# Patient Record
Sex: Male | Born: 1990 | Race: White | Hispanic: No | Marital: Single | Smoking: Former smoker
Health system: Southern US, Community
[De-identification: ages and names within clinical notes are randomized; demographics above are authoritative.]

## PROBLEM LIST (undated history)

## (undated) DIAGNOSIS — J45909 Unspecified asthma, uncomplicated: Secondary | ICD-10-CM

## (undated) HISTORY — PX: OTHER SURGICAL HISTORY: SHX169

---
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
## 2014-07-30 ENCOUNTER — Emergency Department (HOSPITAL_COMMUNITY)
Admission: EM | Admit: 2014-07-30 | Discharge: 2014-07-31 | Discharge status: Against Medical Advice | Payer: Self-pay | Attending: Emergency Medicine | Admitting: Emergency Medicine

## 2014-07-30 ENCOUNTER — Encounter (HOSPITAL_COMMUNITY): Payer: Self-pay | Admitting: Emergency Medicine

## 2014-07-30 ENCOUNTER — Emergency Department (HOSPITAL_COMMUNITY): Payer: Self-pay

## 2014-07-30 DIAGNOSIS — J45901 Unspecified asthma with (acute) exacerbation: Secondary | ICD-10-CM | POA: Insufficient documentation

## 2014-07-30 DIAGNOSIS — Z720 Tobacco use: Secondary | ICD-10-CM | POA: Insufficient documentation

## 2014-07-30 HISTORY — DX: Unspecified asthma, uncomplicated: J45.909

## 2014-07-30 MED ORDER — ALBUTEROL SULFATE (2.5 MG/3ML) 0.083% IN NEBU
5.0000 mg | INHALATION_SOLUTION | Freq: Once | RESPIRATORY_TRACT | Status: AC
  Administered 2014-07-30: 5 mg via RESPIRATORY_TRACT
  Filled 2014-07-30: qty 6

## 2014-07-30 NOTE — ED Notes (Signed)
Pt. reports progressing asthma with productive cough/wheezing for 2 days , pt. ran out of his rescue MDI , denies fever or chills.

## 2014-09-14 ENCOUNTER — Encounter (HOSPITAL_COMMUNITY): Payer: Self-pay | Admitting: Emergency Medicine

## 2014-09-14 ENCOUNTER — Emergency Department (HOSPITAL_COMMUNITY)
Admission: EM | Admit: 2014-09-14 | Discharge: 2014-09-14 | Disposition: A | Discharge status: Home / Self Care | Payer: Self-pay | Attending: Emergency Medicine | Admitting: Emergency Medicine

## 2014-09-14 DIAGNOSIS — Z720 Tobacco use: Secondary | ICD-10-CM | POA: Insufficient documentation

## 2014-09-14 DIAGNOSIS — J45901 Unspecified asthma with (acute) exacerbation: Secondary | ICD-10-CM | POA: Insufficient documentation

## 2014-09-14 DIAGNOSIS — R000 Tachycardia, unspecified: Secondary | ICD-10-CM | POA: Insufficient documentation

## 2014-09-14 MED ORDER — ALBUTEROL SULFATE HFA 108 (90 BASE) MCG/ACT IN AERS
2.0000 | INHALATION_SPRAY | RESPIRATORY_TRACT | Status: DC | PRN
  Administered 2014-09-14: 2 via RESPIRATORY_TRACT
  Filled 2014-09-14: qty 6.7

## 2014-09-14 MED ORDER — PREDNISONE 20 MG PO TABS: 40.0000 mg | ORAL_TABLET | Freq: Every day | ORAL | Status: DC

## 2014-09-14 MED ORDER — ALBUTEROL SULFATE (2.5 MG/3ML) 0.083% IN NEBU
5.0000 mg | INHALATION_SOLUTION | Freq: Once | RESPIRATORY_TRACT | Status: AC
  Administered 2014-09-14: 5 mg via RESPIRATORY_TRACT
  Filled 2014-09-14 (×2): qty 6

## 2014-09-14 MED ORDER — IPRATROPIUM BROMIDE 0.02 % IN SOLN
0.5000 mg | Freq: Once | RESPIRATORY_TRACT | Status: AC
  Administered 2014-09-14: 0.5 mg via RESPIRATORY_TRACT
  Filled 2014-09-14: qty 2.5

## 2014-09-14 MED ORDER — ALBUTEROL SULFATE HFA 108 (90 BASE) MCG/ACT IN AERS: 1.0000 | INHALATION_SPRAY | Freq: Four times a day (QID) | RESPIRATORY_TRACT | Status: DC | PRN

## 2014-09-14 MED ORDER — ALBUTEROL SULFATE (2.5 MG/3ML) 0.083% IN NEBU
5.0000 mg | INHALATION_SOLUTION | Freq: Once | RESPIRATORY_TRACT | Status: AC
Start: 2014-09-14 — End: 2014-09-14
  Administered 2014-09-14: 5 mg via RESPIRATORY_TRACT
  Filled 2014-09-14: qty 6

## 2014-09-14 MED ORDER — PREDNISONE 20 MG PO TABS
60.0000 mg | ORAL_TABLET | Freq: Once | ORAL | Status: AC
  Administered 2014-09-14: 60 mg via ORAL
  Filled 2014-09-14: qty 3

## 2014-09-14 NOTE — Discharge Instructions (Signed)

## 2014-09-14 NOTE — ED Provider Notes (Signed)
CSN: [id number]     Arrival date & time 09/14/14  2132 History   First MD Initiated Contact with Patient 09/14/14 2141     Chief Complaint  Patient presents with  . Shortness of Breath     (Consider location/radiation/quality/duration/timing/severity/associated sxs/prior Treatment) HPI Comments: Patient with history of asthma presents to the emergency department with chief complaint of asthma exacerbation. Patient states that he has been around dogs for the past 2 days. 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 He reports associated wheezing, shortness of breath, and chest tightness. He has taken an inhaler with minimal relief. He denies any fevers, chills, or productive cough. Denies any other health problems.  The history is provided by the patient. No language interpreter was used.    Past Medical History  Diagnosis Date  . Asthma    History reviewed. No pertinent past surgical history. No family history on file. History  Substance Use Topics  . Smoking status: Current Some Day Smoker  . Smokeless tobacco: Not on file  . Alcohol Use: No    Review of Systems  Constitutional: Negative for fever and chills.  Respiratory: Positive for chest tightness, shortness of breath and wheezing.   Cardiovascular: Negative for chest pain.  Gastrointestinal: Negative for nausea, vomiting, diarrhea and constipation.  Genitourinary: Negative for dysuria.  All other systems reviewed and are negative.     Allergies  Review of patient's allergies indicates no known allergies.  Home Medications   Prior to Admission medications   Not on File   BP 135/73 mmHg  Pulse 110  Temp(Src) 98.4 F (36.9 C) (Oral)  Resp 22  Ht 6' (1.829 m)  Wt 285 lb (129.275 kg)  BMI 38.64 kg/m2  SpO2 93% Physical Exam  Constitutional: He is oriented to person, place, and time. He appears well-developed and well-nourished.  HENT:  Head: Normocephalic and  atraumatic.  Right Ear: External ear normal.  Left Ear: External ear normal.  Mouth/Throat: Oropharynx is clear and moist. No oropharyngeal exudate.  Swollen, erythematous turbinates, maxillary sinuses tender to palpation  Eyes: Conjunctivae and EOM are normal. Pupils are equal, round, and reactive to light.  Neck: Normal range of motion. Neck supple.  Cardiovascular: Regular rhythm and normal heart sounds.   Mildly tachycardic  Pulmonary/Chest: Effort normal. No respiratory distress. He has wheezes. He has no rales. He exhibits no tenderness.  Bilateral and expiratory wheezes  Abdominal: Soft. Bowel sounds are normal.  Musculoskeletal: Normal range of motion.  Neurological: He is alert and oriented to person, place, and time.  Skin: Skin is warm and dry.  Psychiatric: He has a normal mood and affect. His behavior is normal. Judgment and thought content normal.  Nursing note and vitals reviewed.   ED Course  Procedures (including critical care time) Labs Review Labs Reviewed - No data to display  Imaging Review No results found.   EKG Interpretation None      MDM   Final diagnoses:  Asthma exacerbation    Patient with asthma exacerbation, will treat with nebulizer and steroids. Will reassess.  11:26 PM Patient ambulated in ED with O2 saturations maintained >90, no current signs of respiratory distress. Lung exam improved after nebulizer treatment. Prednisone given in the ED and pt will bd dc with 5 day burst. Pt states they are breathing at baseline. Pt has been instructed to continue using prescribed medications and to speak with PCP about today's exacerbation.  Roxy Horsemanobert Shabria Egley, PA-C 09/14/14 2332  Rolland PorterMark James, MD 09/19/14 [phone]

## 2014-09-14 NOTE — ED Notes (Signed)
Pt presents with shortness of breath and chest pain for the past 2 days- pt admits to having asthma and being around cigarette smoke and dogs has triggered his asthma.  Pt does not have an inhaler.

## 2015-03-28 ENCOUNTER — Encounter (HOSPITAL_COMMUNITY): Payer: Self-pay | Admitting: Emergency Medicine

## 2015-03-28 ENCOUNTER — Emergency Department (HOSPITAL_COMMUNITY)
Admission: EM | Admit: 2015-03-28 | Discharge: 2015-03-29 | Disposition: A | Discharge status: Home / Self Care | Payer: Medicaid Other | Attending: Emergency Medicine | Admitting: Emergency Medicine

## 2015-03-28 DIAGNOSIS — Z7952 Long term (current) use of systemic steroids: Secondary | ICD-10-CM | POA: Diagnosis not present

## 2015-03-28 DIAGNOSIS — R42 Dizziness and giddiness: Secondary | ICD-10-CM | POA: Diagnosis not present

## 2015-03-28 DIAGNOSIS — Z720 Tobacco use: Secondary | ICD-10-CM | POA: Insufficient documentation

## 2015-03-28 DIAGNOSIS — J4520 Mild intermittent asthma, uncomplicated: Secondary | ICD-10-CM

## 2015-03-28 DIAGNOSIS — Z79899 Other long term (current) drug therapy: Secondary | ICD-10-CM | POA: Insufficient documentation

## 2015-03-28 DIAGNOSIS — J45909 Unspecified asthma, uncomplicated: Secondary | ICD-10-CM | POA: Diagnosis present

## 2015-03-28 DIAGNOSIS — J4521 Mild intermittent asthma with (acute) exacerbation: Secondary | ICD-10-CM | POA: Insufficient documentation

## 2015-03-28 MED ORDER — ALBUTEROL SULFATE (2.5 MG/3ML) 0.083% IN NEBU: 5.0000 mg | INHALATION_SOLUTION | Freq: Four times a day (QID) | RESPIRATORY_TRACT | Status: DC | PRN

## 2015-03-28 MED ORDER — ALBUTEROL SULFATE HFA 108 (90 BASE) MCG/ACT IN AERS
2.0000 | INHALATION_SPRAY | RESPIRATORY_TRACT | Status: DC | PRN
  Administered 2015-03-29: 2 via RESPIRATORY_TRACT
  Filled 2015-03-28: qty 6.7

## 2015-03-28 NOTE — Discharge Instructions (Signed)
Asthma, Acute Bronchospasm °Acute bronchospasm caused by asthma is also referred to as an asthma attack. Bronchospasm means your air passages become narrowed. The narrowing is caused by inflammation and tightening of the muscles in the air tubes (bronchi) in your lungs. This can make it hard to breathe or cause you to wheeze and cough. °CAUSES °Possible triggers are: °· Animal dander from the skin, hair, or feathers of animals. °· Dust mites contained in [place] dust. °· Cockroaches. °· Pollen from trees or grass. °· Mold. °· Cigarette or tobacco smoke. °· Air pollutants such as dust, household cleaners, hair sprays, aerosol sprays, paint fumes, strong chemicals, or strong odors. °· Cold air or weather changes. Cold air may trigger inflammation. Winds increase molds and pollens in the air. °· Strong emotions such as crying or laughing hard. °· Stress. °· Certain medicines such as aspirin or beta-blockers. °· Sulfites in foods and drinks, such as dried fruits and wine. °· Infections or inflammatory conditions, such as a flu, cold, or inflammation of the nasal membranes (rhinitis). °· Gastroesophageal reflux disease (GERD). GERD is a condition where stomach acid backs up into your esophagus. °· Exercise or strenuous activity. °SIGNS AND SYMPTOMS  °· Wheezing. °· Excessive coughing, particularly at night. °· Chest tightness. °· Shortness of breath. °DIAGNOSIS  °Your health care provider will ask you about your medical history and perform a physical exam. A chest X-ray or blood testing may be performed to look for other causes of your symptoms or other conditions that may have triggered your asthma attack.  °TREATMENT  °Treatment is aimed at reducing inflammation and opening up the airways in your lungs.  Most asthma attacks are treated with inhaled medicines. These include quick relief or rescue medicines (such as bronchodilators) and controller medicines (such as inhaled corticosteroids). These medicines are sometimes  given through an inhaler or a nebulizer. Systemic steroid medicine taken by mouth or given through an IV tube also can be used to reduce the inflammation when an attack is moderate or severe. Antibiotic medicines are only used if a bacterial infection is present.  °HOME CARE INSTRUCTIONS  °· Rest. °· Drink plenty of liquids. This helps the mucus to remain thin and be easily coughed up. Only use caffeine in moderation and do not use alcohol until you have recovered from your illness. °· Do not smoke. Avoid being exposed to secondhand smoke. °· You play a critical role in keeping yourself in good health. Avoid exposure to things that cause you to wheeze or to have breathing problems. °· Keep your medicines up-to-date and available. Carefully follow your health care provider's treatment plan. °· Take your medicine exactly as prescribed. °· When pollen or pollution is bad, keep windows closed and use an air conditioner or go to places with air conditioning. °· Asthma requires careful medical care. See your health care provider for a follow-up as advised. If you are more than [redacted] weeks pregnant and you were prescribed any new medicines, let your obstetrician know about the visit and how you are doing. Follow up with your health care provider as directed. °· After you have recovered from your asthma attack, make an appointment with your outpatient doctor to talk about ways to reduce the likelihood of future attacks. If you do not have a doctor who manages your asthma, make an appointment with a primary care doctor to discuss your asthma. °SEEK IMMEDIATE MEDICAL CARE IF:  °· You are getting worse. °· You have trouble breathing. If severe, call your local   emergency services (911 in the [place] °· You develop chest pain or discomfort. °· You are vomiting. °· You are not able to keep fluids down. °· You are coughing up yellow, green, brown, or bloody sputum. °· You have a fever and your symptoms suddenly get worse. °· You have  trouble swallowing. °MAKE SURE YOU:  °· Understand these instructions. °· Will watch your condition. °· Will get help right away if you are not doing well or get worse. °Document Released: 01/21/2007 Document Revised: 10/11/2013 Document Reviewed: 04/13/2013 °ExitCare® Patient Information ©2015 [employer]. This information is not intended to replace advice given to you by your health care provider. Make sure you discuss any questions you have with your health care provider. ° ° °Emergency Department Resource Guide °1) Find a Doctor and Pay Out of Pocket °Although you won't have to find out who is covered by your insurance plan, it is a good idea to ask around and get recommendations. You will then need to call the office and see if the doctor you have chosen will accept you as a new patient and what types of options they offer for patients who are self-pay. Some doctors offer discounts or will set up payment plans for their patients who do not have insurance, but you will need to ask so you aren't surprised when you get to your appointment. ° °2) Contact Your Local Health Department °Not all health departments have doctors that can see patients for sick visits, but many do, so it is worth a call to see if yours does. If you don't know where your local health department is, you can check in your phone book. The CDC also has a tool to help you locate your state's health department, and many state websites also have listings of all of their local health departments. ° °3) Find a Walk-in Clinic °If your illness is not likely to be very severe or complicated, you may want to try a walk in clinic. These are popping up all over the country in pharmacies, drugstores, and shopping centers. They're usually staffed by nurse practitioners or physician assistants that have been trained to treat common illnesses and complaints. They're usually fairly quick and inexpensive. However, if you have serious medical issues or chronic  medical problems, these are probably not your best option. ° °No Primary Care Doctor: °- Call [facility] at  832-8000 - they can help you locate a primary care doctor that  accepts your insurance, provides certain services, etc. °- Physician Referral Service- 1-800-533-3463 ° °Chronic Pain Problems: °Organization         Address  Phone   Notes  °[facility]  (336) 297-2271 Patients need to be referred by their primary care doctor.  ° °Medication Assistance: °Organization         Address  Phone   Notes  °[place] [facility] [address] °, [place] [address] (336) 641-8030 --Must be a resident of [place] °-- Must have NO insurance coverage whatsoever (no Medicaid/ Medicare, etc.) °-- The pt. MUST have a primary care doctor that directs their care regularly and follows them in the community °  °[facility]  (866) 331-1348   °[employer]  (888) 892-1162   ° °Agencies that provide inexpensive medical care: °Organization         Address  Phone   Notes  °[facility]  (336) 832-8035   °[facility]    (  336) 832-7272   °[facility] [address] °[place], [place] [address] (336) 832-4777   °[facility] [address], °[place] (336) 271-4999   °[facility]    (336) 373-0678   °[facility]    (336) 272-1050   °[facility] ° [address], [place] Phone:  (336) 832-4444, Fax:  (336) 832-4440 Hours of Operation:  9 am - 6 pm, M-F.  Also accepts Medicaid/Medicare and self-pay.  °[facility] ° [address], [place] Phone: (336) 832-3150, Fax: (336) [phone]. Hours of Operation:  8:30 am - 5:30 pm, M-F.  Also accepts Medicaid and self-pay.  °[facility] [place] [address], [place] Phone: (336) 878-6027   °[facility] [address], [place], [place] (336)723-1848,  Ext. 123 Mondays & Thursdays: 7-9 AM.  First 15 patients are seen on a first come, first serve basis. °  ° °Medicaid-accepting [place] Providers: ° °Organization         Address  Phone   Notes  °[facility] [address], [place] (336) 641-2100 Also accepts self-pay patients.  °[facility] [address], [place] ° (336) 856-9996   °[facility] [address], [place] (336) 288-8857   °[facility] [address] [place] [address], [place] (336) 299-7000   °Veita Bland [address], [place]  ° (336) 373-1557 Only accepts [place] Access Medicaid patients after they have their name applied to their card.  ° °Self-Pay (no insurance) in [place]: ° °Organization         Address  Phone   Notes  °Sickle Cell Patients, [facility] [address], [place] (336) 832-1970   °[facility] [address], [place] (336) 832-4400   °[facility] ° [address], [place] (336) 992-4800   °[facility]/Dr. Osei-Bonsu ° [address] [place] [address], [place] or [address], [place] (336) 841-8500 Phone number for both [place] and [place] locations is the same.  °[facility] [address], [place] (336) 299-0000   °[facility] [address] [place] [address], [place] or [address] (336) 852-7530 °(336) 878-2260   °[facility] [address], [place] (336) 350-1642, phone; (336) 294-5005, fax Sees patients 1st and 3rd Saturday of every month.  Must not qualify for public or private insurance (i.e. Medicaid, Medicare, [place] Health Choice, Veterans' Benefits) • Household income should be no more than 200% of the poverty level •The clinic cannot treat you if you are pregnant or think you are pregnant • Sexually transmitted diseases are not  treated at the clinic.  ° ° °Dental Care: °Organization         Address  Phone  Notes  °[facility] [address], [place] (336) 641-6152 Accepts children up to age 21 who are enrolled in Medicaid or [place] Health Choice; pregnant women with a Medicaid card; and children who have applied for Medicaid or [place] Health Choice, but were declined, whose parents can pay a reduced fee at time of service.  °[facility] [place]  [address], [place] (336) 641-7733 Accepts children up   to age 21 who are enrolled in Medicaid or [place] Health Choice; pregnant women with a Medicaid card; and children who have applied for Medicaid or [place] Health Choice, but were declined, whose parents can pay a reduced fee at time of service.  °[facility] ° [address], [place] (336) 641-4533 Patients are seen by appointment only. Walk-ins are not accepted. [facility] will see patients 18 years of age and older. °Monday - Tuesday (8am-5pm) °Most Wednesdays (8:30-5pm) °$30 per visit, cash only  °[facility] ° [address], [place] (336) 641-4533 Patients are seen by appointment only. Walk-ins are not accepted. [facility] will see patients 18 years of age and older. °One Wednesday Evening (Monthly: Volunteer Based).  $30 per visit, cash only  °[facility]  (919) 537-3737 for adults; Children under age 4, call Graduate Pediatric Dentistry at (919) [phone]. Children aged 4-14, please call (919) [phone] to request a pediatric application. ° Dental services are provided in all areas of dental care including fillings, crowns and bridges, complete and partial dentures, implants, gum treatment, root canals, and extractions. Preventive care is also provided. Treatment is provided to both adults and children. °Patients are selected via a lottery and there is  often a waiting list. °  °[facility] [address], °[place] ° (336) 763-8833 [email or web address] °  °[facility] [address], [place], [place] (336)723-1848, Ext. 123 Second and Fourth Thursday of each month, opens at 6:30 AM; Clinic ends at 9 AM.  Patients are seen on a first-come first-served basis, and a limited number are seen during each clinic.  ° °[facility] ° [address], [place], [place] (336) 723-7904   Eligibility Requirements °You must have lived in [place], [place], or [place] counties for at least the last three months. °  You cannot be eligible for state or federal sponsored healthcare insurance, including Veterans Administration, Medicaid, or Medicare. °  You generally cannot be eligible for healthcare insurance through your employer.  °  How to apply: °Eligibility screenings are held every Tuesday and Wednesday afternoon from 1:00 pm until 4:00 pm. You do not need an appointment for the interview!  °[facility] [address], [place], [place] 336-631-2330   °[facility]  336-342-8273   °[facility]  336-703-3100   °[facility]  336-570-6415   ° °Behavioral Health Resources in the Community: °Intensive Outpatient Programs °Organization         Address  Phone  Notes  °[facility] [address], [place], [place] 336-878-6098   °[facility] [address], [place], [place] 336-832-9800   °[facility] [address], [place], [place] ° 336-882-2125   °[facility] [address],  °[place], [place] 1-800-853-5163 or 336-641-4981   °Substance Abuse Resources °Organization         Address  Phone  Notes  °[facility]  336-882-2125   °[facility]  336-784-9470   °[facility]  336-285-9073   °[facility]  336-845-3988   °Residential & Outpatient Substance  Abuse Program  1-800-659-3381   °Psychological Services °Organization         Address  Phone  Notes  °[facility]  336- 832-9600   °[facility]  336- 378-7881   °[facility]   [address], [place] 1-800-853-5163 or 336-641-4981   ° °Mobile Crisis Teams °Organization         Address  Phone  Notes  °[facility], Mobile Crisis Care Unit  1-877-626-1772   °Assertive °[facility] ° [address] [place], [place] 336-834-9664   °Sharon DeEsch [address] °[place] 336-554-5454   ° °Self-Help/Support Groups °Organization         Address  Phone             Notes  °[facility] - variety of support groups  336- 373-1402 Call for more information  °Narcotics Anonymous (NA), [facility] [address], °[place]  2 meetings at this location  ° °Residential Treatment Programs °Organization         Address  Phone  Notes  °[facility] [address],    °[place]  1-866-801-8205   °[facility] ° [address], [place], [place] 704-293-8524   °[facility] [address], [place] 336-845-3988 Admissions: 8am-3pm M-F  °[facility] [address]    °[place], [place] 336-841-1104   °[facility] [address], [place], [place] 336-379-7146   °[facility] [address]  °[place], [place] 336-285-9073   °[facility] - Intensive Outpatient [address], [place], [place] 336-852-3033   °[facility] [address]  °[place], [place] 1-877-615-2722 or 336-784-9470   °[facility] [address] [place], [place] 336-227-7417 Accepts Medicaid  °[facility] [address]  °[place] 1-800-659-3381 Substance Abuse/Addiction Treatment  ° °[place] Behavioral Health Resources °Organization          Address  Phone  Notes  °[facility]  (888) 581-9988   °Julie Brannon, PhD [address] [place], [place]   (336) 349-5553 or (336) 951-0000   °[facility]   [address] °[place], [place] (336) 349-4454   °[facility] [address], [place], [place] (336) 342-8316 Insurance/Medicaid/sponsorship through [facility]  °[facility] [address]                                    [place], [place] (336) 342-8316 Therapy/tele-psych/case  °[facility] [address]  ° [place], [place] (336) 349-2233    °Dr. Arfeen  (336) 349-4544   °[facility] [place]  [employer] [place] [facility] 1) [address], [place] °2) [address], [place] °3)  [address], [place] (336) 349-3220 °(336) 342-7768 ° °(336) 342-8140   °[place] [facility] (336) 342-1394 or (336) 342-3537 (After Hours)    ° ° ° °

## 2015-03-28 NOTE — ED Provider Notes (Signed)
CSN: [id number]     Arrival date & time 03/28/15  2154 History  This chart was scribed for Thomas BilisKevin Campos, MD by Richarda Overlieichard Holland, ED Scribe. This patient was seen in room WTR9/WTR9 and the patient's care was started 11:10 PM.   Chief Complaint  Patient presents with  . Asthma   HPI HPI Comments: Thomas Prince is a 24 y.o. male with a hx of asthma who presents to the Emergency Department complaining of an asthma flare up that occurred today. Pt states he has also felt dizzy like the room is spinning for the last 2 day when he goes to stand up. Pt states he experiences dizziness for about 30 minutes after onset. He says that he has been "seeing little dots" as well. Pt states that he typically uses his asthma inhaler daily but has ran out of inhalers at home. He states he went to an UC about 1 month ago and was prescribed an inhaler but says that Medicaid would not cover it. Pt states that he has not been taking his levothyroxine medication 25micrograms recently. Pt reports recent weight gain. Pt states that he is asymptomatic currently. He reports a hx of smoking. He denies fever, CP or syncope.   Past Medical History  Diagnosis Date  . Asthma    No past surgical history on file. No family history on file. History  Substance Use Topics  . Smoking status: Current Some Day Smoker  . Smokeless tobacco: Not on file  . Alcohol Use: No    Review of Systems  Constitutional: Negative for fever.  Respiratory: Positive for shortness of breath ( typical asthma SOB). Negative for wheezing.   Cardiovascular: Negative for chest pain.  Neurological: Positive for dizziness ( room spinning). Negative for syncope.   Allergies  Review of patient's allergies indicates no known allergies.  Home Medications   Prior to Admission medications   Medication Sig Start Date End Date Taking? Authorizing Provider  albuterol (PROVENTIL HFA;VENTOLIN HFA) 108 (90 BASE) MCG/ACT inhaler Inhale 1 puff into the lungs  every 6 (six) hours as needed for wheezing or shortness of breath. 09/14/14   Roxy Horsemanobert Browning, PA-C  predniSONE (DELTASONE) 20 MG tablet Take 2 tablets (40 mg total) by mouth daily. 09/14/14   Roxy Horsemanobert Browning, PA-C   BP 130/75 mmHg  Pulse 85  Temp(Src) 98.7 F (37.1 C) (Oral)  Resp 20  SpO2 100%   Physical Exam  Constitutional: He is oriented to person, place, and time. He appears well-developed and well-nourished.  HENT:  Head: Normocephalic and atraumatic.  Eyes: Right eye exhibits no discharge. Left eye exhibits no discharge.  Neck: Neck supple. No tracheal deviation present.  Cardiovascular: Normal rate, regular rhythm and normal heart sounds.   No murmur heard. Pulmonary/Chest: Effort normal and breath sounds normal. No respiratory distress. He has no wheezes. He has no rales.  Abdominal: He exhibits no distension.  Neurological: He is alert and oriented to person, place, and time.  Skin: Skin is warm and dry.  Psychiatric: He has a normal mood and affect. His behavior is normal.  Nursing note and vitals reviewed.   ED Course  Procedures   DIAGNOSTIC STUDIES: Oxygen Saturation is 100% on RA, normal by my interpretation.    COORDINATION OF CARE: 11:17 PM Discussed treatment plan with pt at bedside and pt agreed to plan.  Labs Review Labs Reviewed - No data to display  Imaging Review No results found.   EKG Interpretation None  MDM   Final diagnoses:  None    1. Asthma 2. H/o thyroid disease  Thyroid labs pending for patient's convenience in follow up.  Albuterol provided for outpatient use. Dizziness likely secondary to working in outdoor conditions without significant fluid intake. Not currently symptomatic. Normal exam. Stable for discharge.     I personally performed the services described in this documentation, which was scribed in my presence. The recorded information has been reviewed and is accurate.     Elpidio Anis, PA-C 03/29/15  0023  Thomas Bilis, MD 03/29/15 [phone]

## 2015-03-28 NOTE — ED Notes (Signed)
Pt states that he has asthma and he believes his work is contributing to increased SOB over the past 2 days.  States that he breathes in a lot of fumes and dust at work.  Pt is in NAD, breathing easily, able to speak in full sentences.

## 2015-03-29 LAB — TSH: TSH: 1.448 u[IU]/mL (ref 0.350–4.500)

## 2015-03-29 LAB — T4, FREE: FREE T4: 0.82 ng/dL (ref 0.61–1.12)

## 2015-07-02 ENCOUNTER — Emergency Department (HOSPITAL_COMMUNITY)
Admission: EM | Admit: 2015-07-02 | Discharge: 2015-07-02 | Disposition: A | Discharge status: Home / Self Care | Payer: Medicaid Other | Attending: Emergency Medicine | Admitting: Emergency Medicine

## 2015-07-02 ENCOUNTER — Encounter (HOSPITAL_COMMUNITY): Payer: Self-pay

## 2015-07-02 DIAGNOSIS — Z79899 Other long term (current) drug therapy: Secondary | ICD-10-CM | POA: Insufficient documentation

## 2015-07-02 DIAGNOSIS — J4520 Mild intermittent asthma, uncomplicated: Secondary | ICD-10-CM

## 2015-07-02 DIAGNOSIS — R0602 Shortness of breath: Secondary | ICD-10-CM | POA: Diagnosis present

## 2015-07-02 DIAGNOSIS — Z720 Tobacco use: Secondary | ICD-10-CM | POA: Insufficient documentation

## 2015-07-02 DIAGNOSIS — J4521 Mild intermittent asthma with (acute) exacerbation: Secondary | ICD-10-CM | POA: Insufficient documentation

## 2015-07-02 MED ORDER — ALBUTEROL SULFATE HFA 108 (90 BASE) MCG/ACT IN AERS: 1.0000 | INHALATION_SPRAY | Freq: Four times a day (QID) | RESPIRATORY_TRACT | Status: DC | PRN

## 2015-07-02 NOTE — ED Notes (Signed)
Pt has asthma. Took last of inhaler last night. Woke up this morning feeling tight.

## 2015-07-02 NOTE — ED Provider Notes (Signed)
CSN: [id number]     Arrival date & time 07/02/15  1129 History  This chart was scribed for non-physician practitioner Brien Few, PA-C working with Raeford Razor, MD by Murriel Hopper, ED Scribe. This patient was seen in room WTR9/WTR9 and the patient's care was started at 8:33 PM.    Chief Complaint  Patient presents with  . Shortness of Breath      The history is provided by the patient. No language interpreter was used.   HPI Comments: Thomas Prince is a 24 y.o. male who presents to the Emergency Department complaining of constant SOB with associated wheezing that has been present since this morning. Pt states he ran out of medication for his inhaler last night and woke up this morning having trouble breathing. Pt states the last time he received a prescription for his inhaler was a few months ago at an urgent care facility, and requests to have his prescription refilled. Pt states his inhaler prescription is albuterol.  Pt states he does not currently have a PCP as he just moved down here from [place] a few months ago. Pt reports waking up this morning and drinking coffee which provided him with moderate relief. Pt also reports he had a cold last week and is still having some mild symptoms that include a productive cough with clear and yellow phlegm. Pt denies any other symptoms or concerns.   Past Medical History  Diagnosis Date  . Asthma    History reviewed. No pertinent past surgical history. History reviewed. No pertinent family history. Social History  Substance Use Topics  . Smoking status: Current Some Day Smoker  . Smokeless tobacco: None  . Alcohol Use: No    Review of Systems  All other systems reviewed and are negative.     Allergies  Review of patient's allergies indicates no known allergies.  Home Medications   Prior to Admission medications   Medication Sig Start Date End Date Taking? Authorizing Provider  albuterol (PROVENTIL HFA;VENTOLIN HFA) 108 (90  BASE) MCG/ACT inhaler Inhale 1-2 puffs into the lungs every 6 (six) hours as needed for wheezing or shortness of breath. 07/02/15   Eyvonne Mechanic, PA-C  albuterol (PROVENTIL) (2.5 MG/3ML) 0.083% nebulizer solution Take 6 mLs (5 mg total) by nebulization every 6 (six) hours as needed for wheezing or shortness of breath. 03/28/15   Elpidio Anis, PA-C  Ferrous Sulfate (IRON) 90 (18 FE) MG TABS Take 1 tablet by mouth daily.    Historical Provider, MD  predniSONE (DELTASONE) 20 MG tablet Take 2 tablets (40 mg total) by mouth daily. Patient not taking: Reported on 03/28/2015 09/14/14   Roxy Horseman, PA-C   BP 135/71 mmHg  Pulse 77  Temp(Src) 98.1 F (36.7 C) (Oral)  Resp 20  SpO2 100% Physical Exam  Constitutional: He is oriented to person, place, and time. He appears well-developed and well-nourished.  HENT:  Head: Normocephalic and atraumatic.  Cardiovascular: Normal rate, regular rhythm and normal heart sounds.  Exam reveals no gallop and no friction rub.   No murmur heard. Pulmonary/Chest: Effort normal and breath sounds normal. No respiratory distress. He has no wheezes. He has no rales. He exhibits no tenderness.  Abdominal: He exhibits no distension.  Neurological: He is alert and oriented to person, place, and time.  Skin: Skin is warm and dry.  Psychiatric: He has a normal mood and affect.  Nursing note and vitals reviewed.   ED Course  Procedures (including critical care time)  DIAGNOSTIC  STUDIES: Oxygen Saturation is 100% on room air, normal by my interpretation.    COORDINATION OF CARE: 12:41 PM Discussed treatment plan with pt at bedside and pt agreed to plan.   Labs Review Labs Reviewed - No data to display  Imaging Review No results found. I have personally reviewed and evaluated these images and lab results as part of my medical decision-making.   EKG Interpretation None      MDM   Final diagnoses:  Asthma, mild intermittent, uncomplicated   Labs:  Imaging:  Consults:  Therapeutics:  Discharge Meds:   Assessment/Plan: Patient in no acute distress, no abnormal lung sounds, vital signs are reassuring. Symptoms have resolved at the time my evaluation. Pt will be prescribed his normally used breathing treatment. Pt advised to call insurance company and find PCP in the area. Pt advised to come back to ED if symptoms occur again before he can find PCP.    I personally performed the services described in this documentation, which was scribed in my presence. The recorded information has been reviewed and is accurate.    Eyvonne Mechanic, PA-C 07/06/15 2034  Raeford Razor, MD 07/12/15 [phone]

## 2015-07-02 NOTE — Discharge Instructions (Signed)

## 2015-08-13 ENCOUNTER — Encounter (HOSPITAL_COMMUNITY): Payer: Self-pay | Admitting: Emergency Medicine

## 2015-08-13 ENCOUNTER — Emergency Department (HOSPITAL_COMMUNITY)
Admission: EM | Admit: 2015-08-13 | Discharge: 2015-08-13 | Disposition: A | Discharge status: Home / Self Care | Payer: Medicaid Other | Source: Home / Self Care | Attending: Family Medicine | Admitting: Family Medicine

## 2015-08-13 DIAGNOSIS — J4521 Mild intermittent asthma with (acute) exacerbation: Secondary | ICD-10-CM

## 2015-08-13 MED ORDER — ALBUTEROL SULFATE HFA 108 (90 BASE) MCG/ACT IN AERS: 2.0000 | INHALATION_SPRAY | RESPIRATORY_TRACT | Status: AC | PRN

## 2015-08-13 MED ORDER — PREDNISONE 20 MG PO TABS: ORAL_TABLET | ORAL | Status: AC

## 2015-08-13 NOTE — Discharge Instructions (Signed)
Asthma Attack Prevention °While you may not be able to control the fact that you have asthma, you can take actions to prevent asthma attacks. The best way to prevent asthma attacks is to maintain good control of your asthma. You can achieve this by: °· Taking your medicines as directed. °· Avoiding things that can irritate your airways or make your asthma symptoms worse (asthma triggers). °· Keeping track of how well your asthma is controlled and of any changes in your symptoms. °· Responding quickly to worsening asthma symptoms (asthma attack). °· Seeking emergency care when it is needed. °WHAT ARE SOME WAYS TO PREVENT AN ASTHMA ATTACK? °Have a Plan °Work with your health care provider to create a written plan for managing and treating your asthma attacks (asthma action plan). This plan includes: °· A list of your asthma triggers and how you can avoid them. °· Information on when medicines should be taken and when their dosages should be changed. °· The use of a device that measures how well your lungs are working (peak flow meter). °Monitor Your Asthma °Use your peak flow meter and record your results in a journal every day. A drop in your peak flow numbers on one or more days may indicate the start of an asthma attack. This can happen even before you start to feel symptoms. You can prevent an asthma attack from getting worse by following the steps in your asthma action plan. °Avoid Asthma Triggers °Work with your asthma health care provider to find out what your asthma triggers are. This can be done by: °· Allergy testing. °· Keeping a journal that notes when asthma attacks occur and the factors that may have contributed to them. °· Determining if there are other medical conditions that are making your asthma worse. °Once you have determined your asthma triggers, take steps to avoid them. This may include avoiding excessive or prolonged exposure to: °· Dust. Have someone dust and vacuum your home for you once or  twice a week. Using a high-efficiency particulate arrestance (HEPA) vacuum is best. °· Smoke. This includes campfire smoke, forest fire smoke, and secondhand smoke from tobacco products. °· Pet dander. Avoid contact with animals that you know you are allergic to. °· Allergens from trees, grasses or pollens. Avoid spending a lot of time outdoors when pollen counts are high, and on very windy days. °· Very cold, dry, or humid air. °· Mold. °· Foods that contain high amounts of sulfites. °· Strong odors. °· Outdoor air pollutants, such as engine exhaust. °· Indoor air pollutants, such as aerosol sprays and fumes from household cleaners. °· Household pests, including dust mites and cockroaches, and pest droppings. °· Certain medicines, including NSAIDs. Always talk to your health care provider before stopping or starting any new medicines. °Medicines °Take over-the-counter and prescription medicines only as told by your health care provider. Many asthma attacks can be prevented by carefully following your medicine schedule. Taking your medicines correctly is especially important when you cannot avoid certain asthma triggers. °Act Quickly °If an asthma attack does happen, acting quickly can decrease how severe it is and how long it lasts. Take these steps:  °· Pay attention to your symptoms. If you are coughing, wheezing, or having difficulty breathing, do not wait to see if your symptoms go away on their own. Follow your asthma action plan. °· If you have followed your asthma action plan and your symptoms are not improving, call your health care provider or seek immediate medical care   at the nearest hospital. °It is important to note how often you need to use your fast-acting rescue inhaler. If you are using your rescue inhaler more often, it may mean that your asthma is not under control. Adjusting your asthma treatment plan may help you to prevent future asthma attacks and help you to gain better control of your  condition. °HOW CAN I PREVENT AN ASTHMA ATTACK WHEN I EXERCISE? °Follow advice from your health care provider about whether you should use your fast-acting inhaler before exercising. Many people with asthma experience exercise-induced bronchoconstriction (EIB). This condition often worsens during vigorous exercise in cold, humid, or dry environments. Usually, people with EIB can stay very active by pre-treating with a fast-acting inhaler before exercising. °  °This information is not intended to replace advice given to you by your health care provider. Make sure you discuss any questions you have with your health care provider. °  °Document Released: 09/24/2009 Document Revised: 06/27/2015 Document Reviewed: 03/08/2015 °Elsevier Interactive Patient Education ©2016 [employer] ° °Asthma, Acute Bronchospasm °Acute bronchospasm caused by asthma is also referred to as an asthma attack. Bronchospasm means your air passages become narrowed. The narrowing is caused by inflammation and tightening of the muscles in the air tubes (bronchi) in your lungs. This can make it hard to breathe or cause you to wheeze and cough. °CAUSES °Possible triggers are: °· Animal dander from the skin, hair, or feathers of animals. °· Dust mites contained in [place] dust. °· Cockroaches. °· Pollen from trees or grass. °· Mold. °· Cigarette or tobacco smoke. °· Air pollutants such as dust, household cleaners, hair sprays, aerosol sprays, paint fumes, strong chemicals, or strong odors. °· Cold air or weather changes. Cold air may trigger inflammation. Winds increase molds and pollens in the air. °· Strong emotions such as crying or laughing hard. °· Stress. °· Certain medicines such as aspirin or beta-blockers. °· Sulfites in foods and drinks, such as dried fruits and wine. °· Infections or inflammatory conditions, such as a flu, cold, or inflammation of the nasal membranes (rhinitis). °· Gastroesophageal reflux disease (GERD). GERD is a condition  where stomach acid backs up into your esophagus. °· Exercise or strenuous activity. °SIGNS AND SYMPTOMS  °· Wheezing. °· Excessive coughing, particularly at night. °· Chest tightness. °· Shortness of breath. °DIAGNOSIS  °Your health care provider will ask you about your medical history and perform a physical exam. A chest X-ray or blood testing may be performed to look for other causes of your symptoms or other conditions that may have triggered your asthma attack.  °TREATMENT  °Treatment is aimed at reducing inflammation and opening up the airways in your lungs.  Most asthma attacks are treated with inhaled medicines. These include quick relief or rescue medicines (such as bronchodilators) and controller medicines (such as inhaled corticosteroids). These medicines are sometimes given through an inhaler or a nebulizer. Systemic steroid medicine taken by mouth or given through an IV tube also can be used to reduce the inflammation when an attack is moderate or severe. Antibiotic medicines are only used if a bacterial infection is present.  °HOME CARE INSTRUCTIONS  °· Rest. °· Drink plenty of liquids. This helps the mucus to remain thin and be easily coughed up. Only use caffeine in moderation and do not use alcohol until you have recovered from your illness. °· Do not smoke. Avoid being exposed to secondhand smoke. °· You play a critical role in keeping yourself in good health. Avoid exposure to things that   cause you to wheeze or to have breathing problems. °· Keep your medicines up-to-date and available. Carefully follow your health care provider's treatment plan. °· Take your medicine exactly as prescribed. °· When pollen or pollution is bad, keep windows closed and use an air conditioner or go to places with air conditioning. °· Asthma requires careful medical care. See your health care provider for a follow-up as advised. If you are more than [redacted] weeks pregnant and you were prescribed any new medicines, let your  obstetrician know about the visit and how you are doing. Follow up with your health care provider as directed. °· After you have recovered from your asthma attack, make an appointment with your outpatient doctor to talk about ways to reduce the likelihood of future attacks. If you do not have a doctor who manages your asthma, make an appointment with a primary care doctor to discuss your asthma. °SEEK IMMEDIATE MEDICAL CARE IF:  °· You are getting worse. °· You have trouble breathing. If severe, call your local emergency services (911 in the [place] °· You develop chest pain or discomfort. °· You are vomiting. °· You are not able to keep fluids down. °· You are coughing up yellow, green, brown, or bloody sputum. °· You have a fever and your symptoms suddenly get worse. °· You have trouble swallowing. °MAKE SURE YOU:  °· Understand these instructions. °· Will watch your condition. °· Will get help right away if you are not doing well or get worse. °  °This information is not intended to replace advice given to you by your health care provider. Make sure you discuss any questions you have with your health care provider. °  °Document Released: 01/21/2007 Document Revised: 10/11/2013 Document Reviewed: 04/13/2013 °Elsevier Interactive Patient Education ©2016 [employer] Inc[employer] ° °

## 2015-08-13 NOTE — ED Notes (Signed)
Ran out of pro-air yesterday.  Patient uses inhaler daily.  Denies cold symptoms

## 2015-08-13 NOTE — ED Provider Notes (Signed)
CSN: [id number]     Arrival date & time 08/13/15  1300 History   First MD Initiated Contact with Patient 08/13/15 1314     Chief Complaint  Patient presents with  . Asthma   (Consider location/radiation/quality/duration/timing/severity/associated sxs/prior Treatment) HPI Comments: 24 year old male with a history of asthma states that he was wheezing badly last night and was out of his inhaler. He uses albuterol when necessary. He has been having to use it more often recently. He states he felt much better this morning and he is breathing better this morning. [place] he [place] obtained a PCP. States he is breathing better this morning and he is in no distress. He denies fevers or chills.   Past Medical History  Diagnosis Date  . Asthma    History reviewed. No pertinent past surgical history. No family history on file. Social History  Substance Use Topics  . Smoking status: Current Some Day Smoker  . Smokeless tobacco: None  . Alcohol Use: No    Review of Systems  Constitutional: Positive for activity change. Negative for fever and fatigue.  HENT: Negative.   Respiratory: Positive for cough, shortness of breath and wheezing.   Cardiovascular: Negative for chest pain, palpitations and leg swelling.  Skin: Negative for rash.  Neurological: Negative.     Allergies  Review of patient's allergies indicates no known allergies.  Home Medications   Prior to Admission medications   Medication Sig Start Date End Date Taking? Authorizing Provider  albuterol (PROVENTIL HFA;VENTOLIN HFA) 108 (90 BASE) MCG/ACT inhaler Inhale 2 puffs into the lungs every 4 (four) hours as needed for wheezing or shortness of breath. 08/13/15   Hayden Rasmussenavid Maile Linford, NP  Ferrous Sulfate (IRON) 90 (18 FE) MG TABS Take 1 tablet by mouth daily.    Historical Provider, MD  predniSONE (DELTASONE) 20 MG tablet 3 Tabs PO Days 1-3, then 2 tabs PO Days 4-6, then 1 tab PO Day 7-9, then Half Tab PO  Day 10-12 08/13/15   Hayden Rasmussenavid Taige Housman, NP   Meds Ordered and Administered this Visit  Medications - No data to display  BP 122/76 mmHg  Pulse 77  Temp(Src) 99 F (37.2 C) (Oral)  Resp 16  SpO2 97% No data found.   Physical Exam  Constitutional: He is oriented to person, place, and time. He appears well-developed and well-nourished. No distress.  HENT:  Mouth/Throat: Oropharynx is clear and moist. No oropharyngeal exudate.  Large bilateral palatine tonsils. No erythema or exudates.  Eyes: Conjunctivae and EOM are normal.  Neck: Normal range of motion. Neck supple.  Cardiovascular: Normal rate and normal heart sounds.   Pulmonary/Chest: Effort normal and breath sounds normal. No respiratory distress. He has no wheezes.  Musculoskeletal: He exhibits no edema.  Neurological: He is alert and oriented to person, place, and time. No cranial nerve deficit. He exhibits normal muscle tone.  Skin: Skin is warm and dry.  Psychiatric: He has a normal mood and affect.  Nursing note and vitals reviewed.   ED Course  Procedures (including critical care time)  Labs Review Labs Reviewed - No data to display  Imaging Review No results found.   Visual Acuity Review  Right Eye Distance:   Left Eye Distance:   Bilateral Distance:    Right Eye Near:   Left Eye Near:    Bilateral Near:         MDM   1. Asthma exacerbation attacks, mild intermittent  abluterol HFA Prednisone taper dose. Obtain a PCP ASAP    Hayden Rasmussen, NP 08/13/15 [phone]

## 2016-10-05 ENCOUNTER — Emergency Department (HOSPITAL_COMMUNITY): Payer: BLUE CROSS/BLUE SHIELD

## 2016-10-05 ENCOUNTER — Encounter (HOSPITAL_COMMUNITY): Payer: Self-pay | Admitting: Oncology

## 2016-10-05 DIAGNOSIS — R0789 Other chest pain: Secondary | ICD-10-CM | POA: Diagnosis present

## 2016-10-05 DIAGNOSIS — R062 Wheezing: Secondary | ICD-10-CM | POA: Diagnosis not present

## 2016-10-05 DIAGNOSIS — R05 Cough: Secondary | ICD-10-CM | POA: Diagnosis not present

## 2016-10-05 DIAGNOSIS — Z87891 Personal history of nicotine dependence: Secondary | ICD-10-CM | POA: Insufficient documentation

## 2016-10-05 NOTE — ED Triage Notes (Signed)
Pt had a sinus infection last week.  Pt states two days ago he sneezed and felt like, "Something ripped" in his right ribcage area.  Pt rates pain 7/10, stabbing in nature.

## 2016-10-06 ENCOUNTER — Emergency Department (HOSPITAL_COMMUNITY): Payer: BLUE CROSS/BLUE SHIELD

## 2016-10-06 ENCOUNTER — Emergency Department (HOSPITAL_COMMUNITY)
Admission: EM | Admit: 2016-10-06 | Discharge: 2016-10-06 | Disposition: A | Discharge status: Home / Self Care | Payer: BLUE CROSS/BLUE SHIELD | Attending: Emergency Medicine | Admitting: Emergency Medicine

## 2016-10-06 DIAGNOSIS — R0789 Other chest pain: Secondary | ICD-10-CM

## 2016-10-06 DIAGNOSIS — R079 Chest pain, unspecified: Secondary | ICD-10-CM

## 2016-10-06 DIAGNOSIS — R062 Wheezing: Secondary | ICD-10-CM

## 2016-10-06 DIAGNOSIS — R05 Cough: Secondary | ICD-10-CM

## 2016-10-06 DIAGNOSIS — R059 Cough, unspecified: Secondary | ICD-10-CM

## 2016-10-06 MED ORDER — IPRATROPIUM BROMIDE 0.02 % IN SOLN
0.5000 mg | Freq: Once | RESPIRATORY_TRACT | Status: AC
  Administered 2016-10-06: 0.5 mg via RESPIRATORY_TRACT
  Filled 2016-10-06: qty 2.5

## 2016-10-06 MED ORDER — ALBUTEROL SULFATE (2.5 MG/3ML) 0.083% IN NEBU
5.0000 mg | INHALATION_SOLUTION | Freq: Once | RESPIRATORY_TRACT | Status: AC
  Administered 2016-10-06: 5 mg via RESPIRATORY_TRACT
  Filled 2016-10-06: qty 6

## 2016-10-06 MED ORDER — ALBUTEROL SULFATE HFA 108 (90 BASE) MCG/ACT IN AERS
2.0000 | INHALATION_SPRAY | RESPIRATORY_TRACT | Status: DC | PRN
  Administered 2016-10-06: 2 via RESPIRATORY_TRACT
  Filled 2016-10-06: qty 6.7

## 2016-10-06 MED ORDER — HYDROCOD POLST-CPM POLST ER 10-8 MG/5ML PO SUER: 5.0000 mL | Freq: Every evening | ORAL | 0 refills | Status: AC | PRN

## 2016-10-06 NOTE — ED Provider Notes (Signed)
[facility]-EMERGENCY DEPT Provider Note   CSN: [id number] Arrival date & time: 10/05/16  2319  By signing my name below, I, Valentino SaxonBianca Contreras, attest that this documentation has been prepared under the direction and in the presence of Yoshino Broccoli A Nirvana Blanchett PA-C,. Electronically Signed: Valentino SaxonBianca Contreras, ED Scribe. 10/06/16. 1:16 AM.  History   Chief Complaint Chief Complaint  Patient presents with  . ribcage pain   The history is provided by the patient. No language interpreter was used.   HPI Comments: Janeece Riggersyler E Cantara is a 25 y.o. male who presents to the Emergency Department complaining of moderate, constant, coughing two weeks.  Pt states he recently ran out of his inhaler about a couple days ago. Pt's spouse notes Pt has a constant cough around this time each year. Pt notes he sneezed "real hard" and felt something pop on his right side two days ago. He reports associated right-sided lower rib-cage pain. No alleviating factors noted. Pt denies CP, SOB, fever, vomiting, nausea, abdominal pain. No additional complaints at this time.  Past Medical History:  Diagnosis Date  . Asthma     There are no active problems to display for this patient.   Past Surgical History:  Procedure Laterality Date  . metal rod in leg Left        Home Medications    Prior to Admission medications   Medication Sig Start Date End Date Taking? Authorizing Provider  albuterol (PROVENTIL HFA;VENTOLIN HFA) 108 (90 BASE) MCG/ACT inhaler Inhale 2 puffs into the lungs every 4 (four) hours as needed for wheezing or shortness of breath. 08/13/15   Hayden Rasmussenavid Mabe, NP  Ferrous Sulfate (IRON) 90 (18 FE) MG TABS Take 1 tablet by mouth daily.    Historical Provider, MD  predniSONE (DELTASONE) 20 MG tablet 3 Tabs PO Days 1-3, then 2 tabs PO Days 4-6, then 1 tab PO Day 7-9, then Half Tab PO Day 10-12 08/13/15   Hayden Rasmussenavid Mabe, NP    Family History No family history on file.  Social History Social History  Substance Use Topics    . Smoking status: Former Games developermoker  . Smokeless tobacco: Never Used  . Alcohol use No     Allergies   Patient has no known allergies.   Review of Systems Review of Systems  Constitutional: Negative for fever.  HENT: Negative for congestion and sore throat.   Respiratory: Positive for cough (constant). Negative for shortness of breath.   Cardiovascular: Positive for chest pain.  Gastrointestinal: Negative for abdominal pain, nausea and vomiting.  Musculoskeletal: Negative for myalgias.  Skin: Negative for rash.     Physical Exam Updated Vital Signs BP 132/96 (BP Location: Left Arm)   Pulse 81   Temp 97.8 F (36.6 C) (Oral)   Resp 20   Ht 6' (1.829 m)   Wt (!) 311 lb 9.6 oz (141.3 kg)   SpO2 95%   BMI 42.26 kg/m   Physical Exam  Constitutional: He appears well-developed and well-nourished.  HENT:  Head: Normocephalic and atraumatic.  Eyes: Conjunctivae are normal. Right eye exhibits no discharge. Left eye exhibits no discharge.  Cardiovascular: Normal rate.   No murmur heard. Pulmonary/Chest: Effort normal. No respiratory distress. He has wheezes. He has no rales. He exhibits tenderness (right lateral lower chest wall tenderness. ).  Right greater than left wheezing. No rhonci.   Abdominal: Soft. There is no tenderness.  Completely non tender. Specfically non tender in the RUQ.   Neurological: He is alert. Coordination normal.  Skin: Skin is warm and dry. No rash noted. He is not diaphoretic. No erythema.  Psychiatric: He has a normal mood and affect.  Nursing note and vitals reviewed.    ED Treatments / Results   DIAGNOSTIC STUDIES: Oxygen Saturation is 95% on RA, adequate by my interpretation.    COORDINATION OF CARE: 1:11 AM Discussed treatment plan with pt at bedside which includes ribs and chest XR and nebulizer solution and pt agreed to plan. Pt was offered pain medications and he denied.    Labs (all labs ordered are listed, but only abnormal results  are displayed) Labs Reviewed - No data to display  EKG  EKG Interpretation None       Radiology Dg Chest 2 View  Result Date: 10/06/2016 CLINICAL DATA:  25 year old male with cold symptoms x2 weeks. Productive cough. A right-sided rib pain. EXAM: CHEST  2 VIEW COMPARISON:  Chest radiograph dated 07/30/2014 FINDINGS: The lungs are clear. There is no pleural effusion or pneumothorax. The cardiac silhouette is within normal limits. No acute osseous pathology. IMPRESSION: No active cardiopulmonary disease. Electronically Signed   By: Elgie CollardArash  Radparvar M.D.   On: 10/06/2016 01:12   Dg Ribs Unilateral Right  Result Date: 10/06/2016 CLINICAL DATA:  Right lower lateral rib pain EXAM: RIGHT RIBS - 2 VIEW COMPARISON:  Chest radiograph 10/05/2016 FINDINGS: No fracture or other bone lesions are seen involving the ribs. IMPRESSION: No displaced right rib fracture. Electronically Signed   By: Deatra RobinsonKevin  Herman M.D.   On: 10/06/2016 01:14    Procedures Procedures (including critical care time)  Medications Ordered in ED Medications  albuterol (PROVENTIL) (2.5 MG/3ML) 0.083% nebulizer solution 5 mg (5 mg Nebulization Given 10/06/16 0122)  ipratropium (ATROVENT) nebulizer solution 0.5 mg (0.5 mg Nebulization Given 10/06/16 0122)     Initial Impression / Assessment and Plan / ED Course  I have reviewed the triage vital signs and the nursing notes.  Pertinent labs & imaging results that were available during my care of the patient were reviewed by me and considered in my medical decision making (see chart for details).  Clinical Course     Patient presents with concern for rib injury after sneezing today. He has had a cough for prolonged period of time, history of asthma, currently wheezing. Nebulizer treatment provided with improvement. .  Imaging negative for rib injury. Suspect chest wall pain with persistent cough. Appropriate for discharge home.   Final Clinical Impressions(s) / ED Diagnoses    Final diagnoses:  Chest pain   1. Wheezing 2. Chest wall pain  New Prescriptions New Prescriptions   No medications on file    I personally performed the services described in this documentation, which was scribed in my presence. The recorded information has been reviewed and is accurate.      Elpidio AnisShari Lincon Sahlin, PA-C 10/06/16 [id number]    Shon Batonourtney F Horton, MD 10/06/16 [phone]

## 2018-03-16 IMAGING — CR DG RIBS 2V*R*
4 series · 4 of 4 positions shown · non-contrast
Comparison: Chest radiograph 10/05/2016

CLINICAL DATA: Right lower lateral rib pain

EXAM:
RIGHT RIBS - 2 VIEW

[w ribs ap upper right]
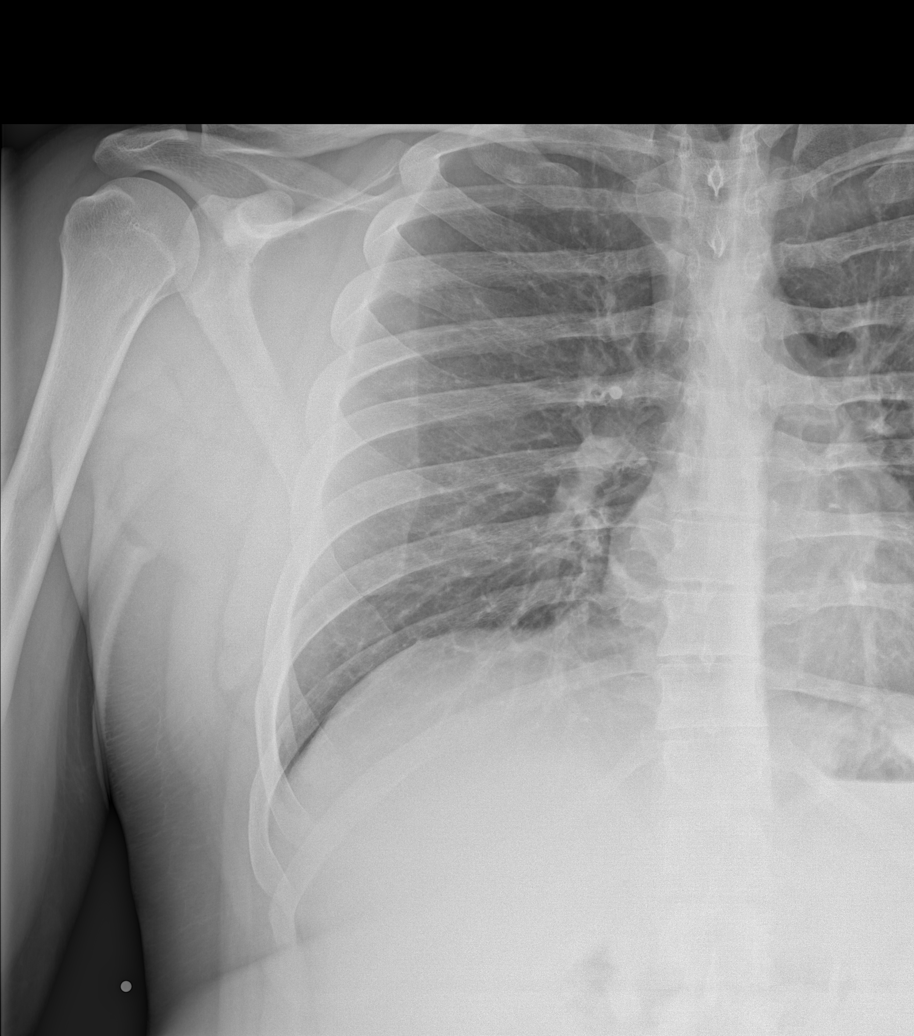

[w ribs ap lower right]
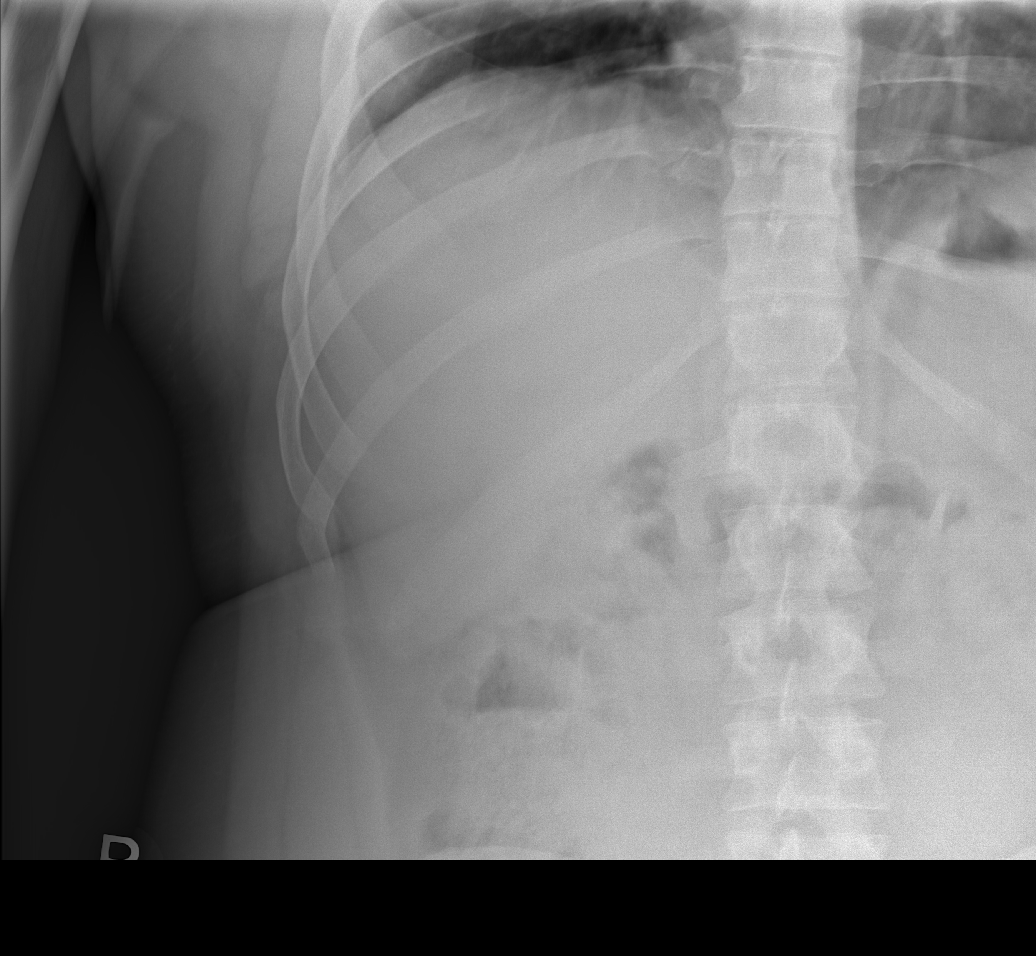

[w ribs obl right (1 of 2)]
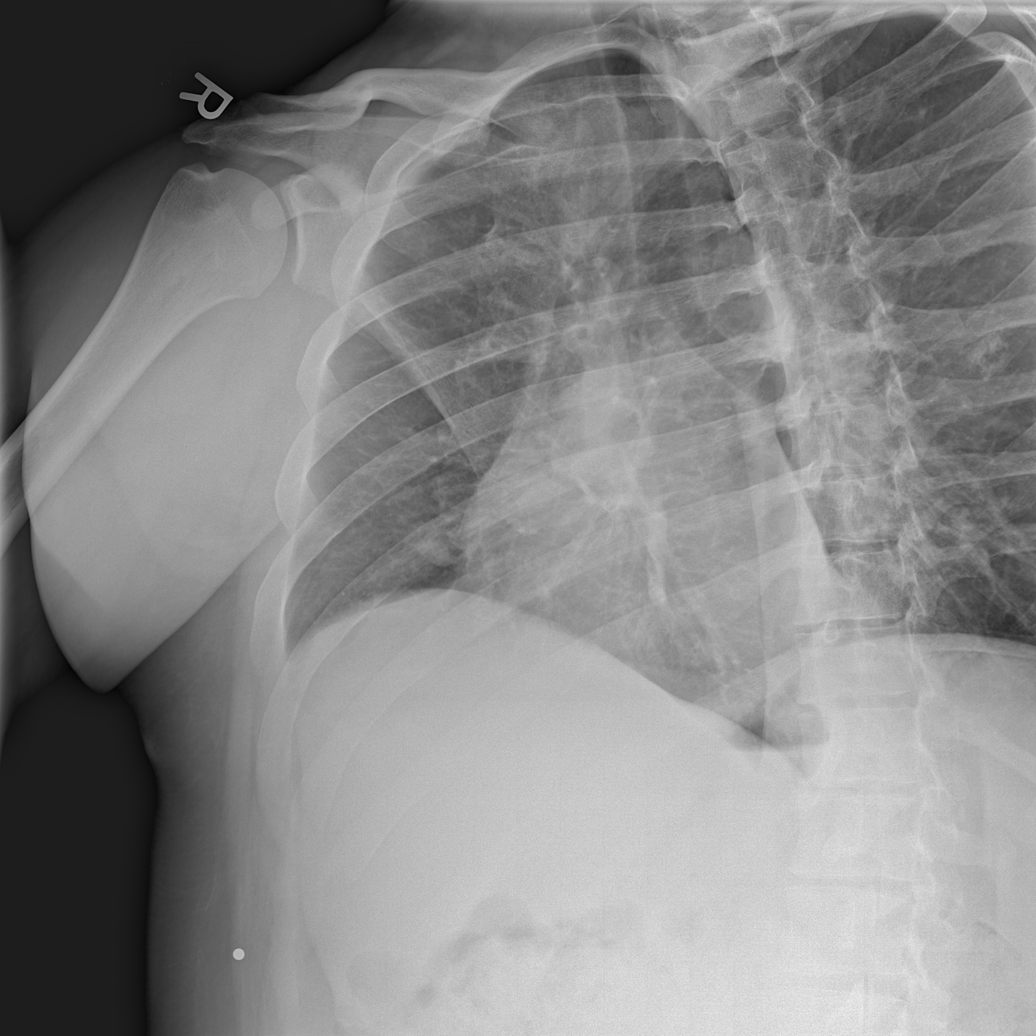

[w ribs obl right (2 of 2)]
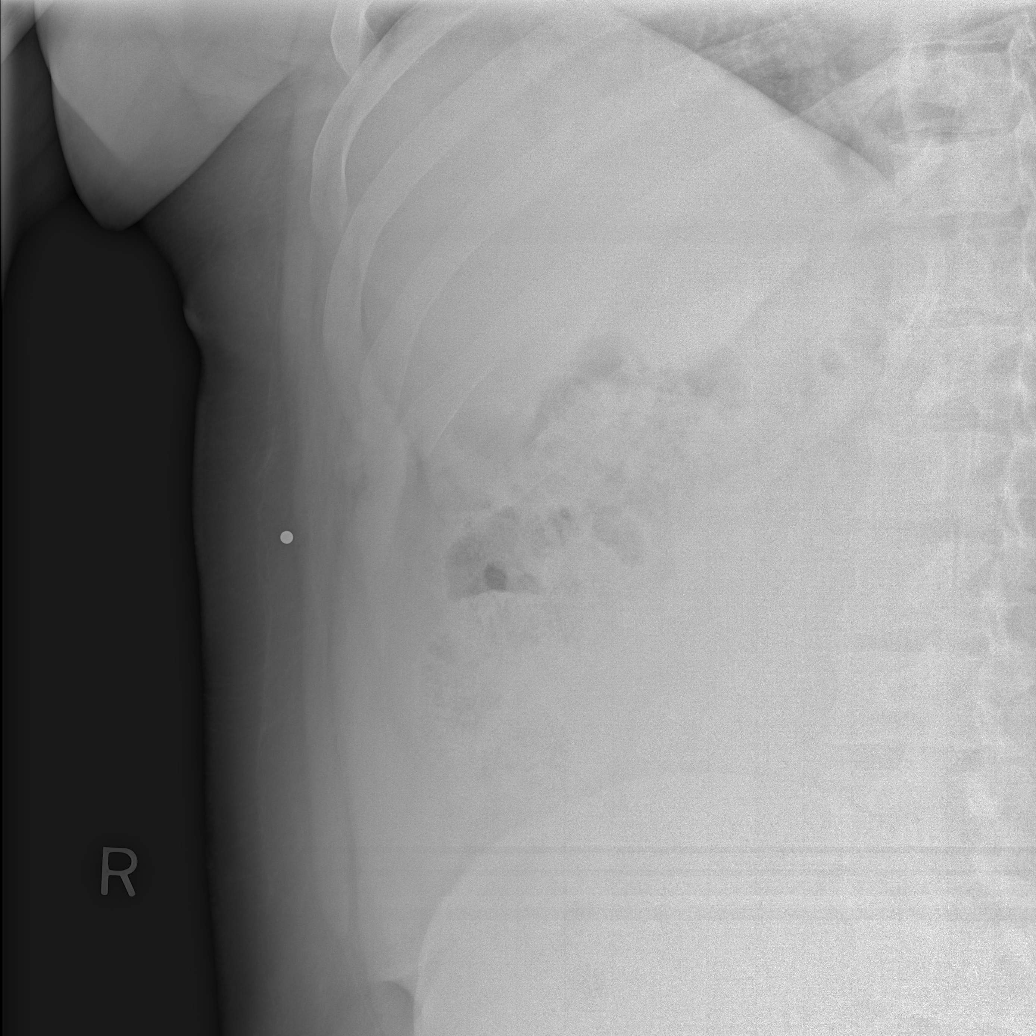

[4 of 4 positions shown; findings below may reference images not displayed]

FINDINGS: No fracture or other bone lesions are seen involving the ribs.
IMPRESSION: No displaced right rib fracture.
